# Patient Record
Sex: Female | Born: 1990 | Race: White | Hispanic: No | Marital: Single | Smoking: Never smoker
Health system: Southern US, Community
[De-identification: ages and names within clinical notes are randomized; demographics above are authoritative.]

## PROBLEM LIST (undated history)

## (undated) DIAGNOSIS — K219 Gastro-esophageal reflux disease without esophagitis: Secondary | ICD-10-CM

## (undated) DIAGNOSIS — F988 Other specified behavioral and emotional disorders with onset usually occurring in childhood and adolescence: Secondary | ICD-10-CM

## (undated) HISTORY — DX: Gastro-esophageal reflux disease without esophagitis: K21.9

## (undated) HISTORY — DX: Other specified behavioral and emotional disorders with onset usually occurring in childhood and adolescence: F98.8

## (undated) HISTORY — PX: WISDOM TOOTH EXTRACTION: SHX21

---
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
## 2012-04-05 ENCOUNTER — Encounter: Payer: Self-pay | Admitting: Gastroenterology

## 2012-05-04 ENCOUNTER — Encounter: Payer: Self-pay | Admitting: Gastroenterology

## 2012-05-04 ENCOUNTER — Other Ambulatory Visit (INDEPENDENT_AMBULATORY_CARE_PROVIDER_SITE_OTHER): Payer: BC Managed Care – PPO

## 2012-05-04 ENCOUNTER — Ambulatory Visit (INDEPENDENT_AMBULATORY_CARE_PROVIDER_SITE_OTHER): Payer: BC Managed Care – PPO | Admitting: Gastroenterology

## 2012-05-04 VITALS — BP 94/68 | HR 88 | Ht 64.0 in | Wt 145.5 lb

## 2012-05-04 DIAGNOSIS — R141 Gas pain: Secondary | ICD-10-CM

## 2012-05-04 DIAGNOSIS — K219 Gastro-esophageal reflux disease without esophagitis: Secondary | ICD-10-CM

## 2012-05-04 DIAGNOSIS — R109 Unspecified abdominal pain: Secondary | ICD-10-CM

## 2012-05-04 DIAGNOSIS — R142 Eructation: Secondary | ICD-10-CM

## 2012-05-04 LAB — BASIC METABOLIC PANEL
BUN: 12 mg/dL (ref 6–23)
Calcium: 9.5 mg/dL (ref 8.4–10.5)
Chloride: 102 mEq/L (ref 96–112)
GFR: 100.89 mL/min (ref >60.00)
Glucose, Bld: 75 mg/dL (ref 70–99)
Potassium: 4.2 mEq/L (ref 3.5–5.1)

## 2012-05-04 LAB — HEPATIC FUNCTION PANEL
ALT: 11 U/L (ref 0–35)
AST: 21 U/L (ref 0–37)
Alkaline Phosphatase: 65 U/L (ref 39–117)
Bilirubin, Direct: 0.1 mg/dL (ref 0.0–0.3)
Total Bilirubin: 0.7 mg/dL (ref 0.3–1.2)

## 2012-05-04 LAB — CBC WITH DIFFERENTIAL/PLATELET
Basophils Absolute: 0.1 10*3/uL (ref 0.0–0.1)
Basophils Relative: 0.8 % (ref 0.0–3.0)
Eosinophils Absolute: 0.3 10*3/uL (ref 0.0–0.7)
MCHC: 33.4 g/dL (ref 30.0–36.0)
MCV: 81.7 fl (ref 78.0–100.0)
Monocytes Absolute: 0.8 10*3/uL (ref 0.1–1.0)
Neutrophils Relative %: 55.4 % (ref 43.0–77.0)
Platelets: 201 10*3/uL (ref 150.0–400.0)
RDW: 13.1 % (ref 11.5–14.6)

## 2012-05-04 LAB — TSH: TSH: 2.5 u[IU]/mL (ref 0.35–5.50)

## 2012-05-04 MED ORDER — OMEPRAZOLE 20 MG PO CPDR: 20.0000 mg | DELAYED_RELEASE_CAPSULE | Freq: Every day | ORAL | Status: DC

## 2012-05-04 NOTE — Progress Notes (Signed)
History of Present Illness: This is a 21 year old female here today with her mother, who is a patient of mine. Patient has had a several year history of postprandial abdominal pain and bloating in the epigastrium and the lower abdomen. This is not associated with bowel movements or any bowel habit changes. She states she generally has one bowel movement per day. She does note increased gas and bloating. She has frequent postprandial chest pain and a dry cough. She does not have typical heartburn. Her symptoms have worsened over the past several months. She states she has tried Zantac on an as-needed basis which has improved her upper abdominal and chest symptoms. An upper GI/small bowel follow-through in 2007 revealed: Normal esophagus and stomach. Transitory spasm of pyloric channel, duodenal bulb. No ulcer or mass is seen. Small bowel is normal. Appendix was partially refluxed. Denies weight loss,constipation, diarrhea, change in stool caliber, melena, hematochezia, nausea, vomiting, dysphagia.  Review of Systems: Pertinent positive and negative review of systems were noted in the above HPI section. All other review of systems were otherwise negative.  Current Medications, Allergies, Past Medical History, Past Surgical History, Family History and Social History were reviewed in [employer] record.  Physical Exam: General: Well developed , well nourished, no acute distress Head: Normocephalic and atraumatic Eyes:  sclerae anicteric, EOMI Ears: Normal auditory acuity Mouth: No deformity or lesions Neck: Supple, no masses or thyromegaly Lungs: Clear throughout to auscultation Heart: Regular rate and rhythm; no murmurs, rubs or bruits Abdomen: Soft, non tender and non distended. No masses, hepatosplenomegaly or hernias noted. Normal Bowel sounds Musculoskeletal: Symmetrical with no gross deformities  Skin: No lesions on visible extremities Pulses:  Normal pulses  noted Extremities: No clubbing, cyanosis, edema or deformities noted Neurological: Alert oriented x 4, grossly nonfocal Cervical Nodes:  No significant cervical adenopathy Inguinal Nodes: No significant inguinal adenopathy Psychological:  Alert and cooperative. Normal mood and affect  Assessment and Recommendations:  1. Presumed GERD. Begin omeprazole 20 mg daily and all standard antireflux measures. If symptoms do not come under adequate control consider further evaluation with upper endoscopy.  2. Abdominal pain, gas and bloating. Avoid any foods that exacerbate symptoms. Blood work as ordered. Begin a low gas diet and Levsin 1-2 before meals and as needed.

## 2012-05-04 NOTE — Patient Instructions (Addendum)
Your physician has requested that you go to the basement for the following lab work before leaving today: We have sent the following medications to your pharmacy for you to pick up at your convenience: Omeprazole. Patient advised to avoid spicy, acidic, citrus, chocolate, mints, fruit and fruit juices.  Limit the intake of caffeine, alcohol and Soda.  Don't exercise too soon after eating.  Don't lie down within 3-4 hours of eating.  Elevate the head of your bed. You have been given a Low-gas diet. cc: Maryellen Pile, MD

## 2012-05-05 ENCOUNTER — Telehealth: Payer: Self-pay | Admitting: Gastroenterology

## 2012-05-05 LAB — CELIAC PANEL 10
Gliadin IgA: 5 U/mL (ref <20)
IgA: 229 mg/dL (ref 69–380)

## 2012-05-05 MED ORDER — HYOSCYAMINE SULFATE 0.125 MG SL SUBL: SUBLINGUAL_TABLET | SUBLINGUAL | Status: DC

## 2012-05-05 NOTE — Telephone Encounter (Signed)
Patient's mother states she was supposed to have two medicines sent into the pharmacy yesterday and she only picked up the omeprazole. Read Dr. Ardell Isaacs office note which does state he wants her daughter to also take Levsin as needed. Sent the prescription for Levsin to patient's pharmacy.

## 2012-05-12 ENCOUNTER — Ambulatory Visit: Payer: Self-pay | Admitting: Gastroenterology

## 2012-06-17 ENCOUNTER — Ambulatory Visit (INDEPENDENT_AMBULATORY_CARE_PROVIDER_SITE_OTHER): Payer: BC Managed Care – PPO | Admitting: Gastroenterology

## 2012-06-17 ENCOUNTER — Encounter: Payer: Self-pay | Admitting: Gastroenterology

## 2012-06-17 VITALS — BP 94/70 | HR 96 | Ht 62.75 in | Wt 145.0 lb

## 2012-06-17 DIAGNOSIS — R143 Flatulence: Secondary | ICD-10-CM

## 2012-06-17 DIAGNOSIS — R109 Unspecified abdominal pain: Secondary | ICD-10-CM

## 2012-06-17 DIAGNOSIS — R142 Eructation: Secondary | ICD-10-CM

## 2012-06-17 NOTE — Patient Instructions (Addendum)
cc: Maryellen Pile, MD

## 2012-06-17 NOTE — Progress Notes (Signed)
History of Present Illness: This is a 21 year old female who returns today for follow up of a several year history of postprandial abdominal pain and bloating in the epigastrium and the lower abdomen. After following recommended dietary changes and using omeprazole and hyoscyamine as directed her symptoms have resolved. She's missed before meals doses of hyoscyamine on several occasions and notes mild postprandial symptoms.  Current Medications, Allergies, Past Medical History, Past Surgical History, Family History and Social History were reviewed in [employer] record.  Physical Exam: General: Well developed , well nourished, no acute distress Head: Normocephalic and atraumatic Eyes:  sclerae anicteric, EOMI Ears: Normal auditory acuity Mouth: No deformity or lesions Lungs: Clear throughout to auscultation Heart: Regular rate and rhythm; no murmurs, rubs or bruits Abdomen: Soft, non tender and non distended. No masses, hepatosplenomegaly or hernias noted. Normal Bowel sounds Musculoskeletal: Symmetrical with no gross deformities  Extremities: No clubbing, cyanosis, edema or deformities noted Neurological: Alert oriented x 4, grossly nonfocal Psychological:  Alert and cooperative. Normal mood and affect  Assessment and Recommendations:  1. Presumed GERD and post prandial bowel symptoms. Continue omeprazole 20 mg daily and hyoscyamine before meals as needed. Continue to follow a low gas diet.

## 2012-12-05 ENCOUNTER — Other Ambulatory Visit: Payer: Self-pay

## 2012-12-05 MED ORDER — OMEPRAZOLE 20 MG PO CPDR: 20.0000 mg | DELAYED_RELEASE_CAPSULE | Freq: Every day | ORAL | Status: DC

## 2013-05-04 ENCOUNTER — Ambulatory Visit (INDEPENDENT_AMBULATORY_CARE_PROVIDER_SITE_OTHER): Payer: BC Managed Care – PPO | Admitting: Gastroenterology

## 2013-05-04 ENCOUNTER — Encounter: Payer: Self-pay | Admitting: Gastroenterology

## 2013-05-04 VITALS — BP 90/60 | HR 88 | Ht 62.75 in | Wt 143.2 lb

## 2013-05-04 DIAGNOSIS — R141 Gas pain: Secondary | ICD-10-CM

## 2013-05-04 DIAGNOSIS — R142 Eructation: Secondary | ICD-10-CM

## 2013-05-04 DIAGNOSIS — K219 Gastro-esophageal reflux disease without esophagitis: Secondary | ICD-10-CM | POA: Insufficient documentation

## 2013-05-04 MED ORDER — OMEPRAZOLE 20 MG PO CPDR: 20.0000 mg | DELAYED_RELEASE_CAPSULE | Freq: Every day | ORAL | Status: DC

## 2013-05-04 MED ORDER — HYOSCYAMINE SULFATE 0.125 MG SL SUBL: SUBLINGUAL_TABLET | SUBLINGUAL | Status: DC

## 2013-05-04 NOTE — Progress Notes (Signed)
History of Present Illness: This is a 22 year old female who complains of intermittent intestinal gas associated with pain and flatulence. She feels that milk products may be producing her gas problems. She has no other GI complaints.  Current Medications, Allergies, Past Medical History, Past Surgical History, Family History and Social History were reviewed in [employer] record.  Physical Exam: General: Well developed , well nourished, no acute distress Head: Normocephalic and atraumatic Eyes:  sclerae anicteric, EOMI Ears: Normal auditory acuity Mouth: No deformity or lesions Lungs: Clear throughout to auscultation Heart: Regular rate and rhythm; no murmurs, rubs or bruits Abdomen: Soft, non tender and non distended. No masses, hepatosplenomegaly or hernias noted. Normal Bowel sounds Musculoskeletal: Symmetrical with no gross deformities  Pulses:  Normal pulses noted Extremities: No clubbing, cyanosis, edema or deformities noted Neurological: Alert oriented x 4, grossly nonfocal Psychological:  Alert and cooperative. Normal mood and affect  Assessment and Recommendations:  1. GERD. Intestinal gas. Possible lactose intolerance. Begin a lactose avoidance diet. Trial of Phazyme 4 times a day when necessary. Continue omeprazole 20 mg daily and hyoscyamine as needed.

## 2013-05-04 NOTE — Patient Instructions (Addendum)
We have sent the following medications to your pharmacy for you to pick up at your convenience: Levsin and omeprazole.  Start over the counter Phazyme or Gas-X four times a day as needed for gas and bloating. Samples of phazyme given to get started.   Decrease or eliminate milk and milk products to reduce the amount of gas and bloating.   Thank you for choosing me and [facility].  Venita Lick. Pleas Koch., MD., Clementeen Graham

## 2013-09-20 ENCOUNTER — Encounter: Payer: Self-pay | Admitting: Podiatry

## 2013-09-20 ENCOUNTER — Ambulatory Visit (INDEPENDENT_AMBULATORY_CARE_PROVIDER_SITE_OTHER): Payer: BC Managed Care – PPO | Admitting: Podiatry

## 2013-09-20 VITALS — BP 122/89 | HR 105 | Resp 16

## 2013-09-20 DIAGNOSIS — L84 Corns and callosities: Secondary | ICD-10-CM

## 2013-09-20 NOTE — Progress Notes (Signed)
   Subjective:    Patient ID: Annette Odom, female    DOB: 1991-08-23, 22 y.o.   MRN: [id number]  HPI Comments: i have these spots on my toes that he cut i think a year ago and the right one is really bad compared to the left      Review of Systems     Objective:   Physical Exam: Pulses are strongly palpable bilateral. Minimal bunion deformities noted bilateral. She does have reactive hyperkeratosis to the medial aspect of the IP joint as well as the first metatarsophalangeal joint. More than likely associated with shoe gear.        Assessment & Plan:  Assessment: Causing calluses bilateral foot.  Plan: Debridement of reactive hyperkeratosis bilateral.

## 2014-05-23 ENCOUNTER — Telehealth: Payer: Self-pay | Admitting: Gastroenterology

## 2014-05-23 MED ORDER — OMEPRAZOLE 20 MG PO CPDR: 20.0000 mg | DELAYED_RELEASE_CAPSULE | Freq: Every day | ORAL | Status: DC

## 2014-05-23 MED ORDER — HYOSCYAMINE SULFATE 0.125 MG SL SUBL: SUBLINGUAL_TABLET | SUBLINGUAL | Status: DC

## 2014-05-23 NOTE — Telephone Encounter (Signed)
Prescriptions sent to pharmacy with one refill until scheduled appt.

## 2014-05-24 ENCOUNTER — Other Ambulatory Visit: Payer: Self-pay | Admitting: Gastroenterology

## 2014-07-02 ENCOUNTER — Ambulatory Visit: Payer: BC Managed Care – PPO | Admitting: Gastroenterology

## 2014-07-09 ENCOUNTER — Encounter: Payer: Self-pay | Admitting: Gastroenterology

## 2014-07-09 ENCOUNTER — Ambulatory Visit (INDEPENDENT_AMBULATORY_CARE_PROVIDER_SITE_OTHER): Payer: BC Managed Care – PPO | Admitting: Gastroenterology

## 2014-07-09 VITALS — BP 98/60 | HR 80 | Ht 62.75 in | Wt 145.0 lb

## 2014-07-09 DIAGNOSIS — K219 Gastro-esophageal reflux disease without esophagitis: Secondary | ICD-10-CM

## 2014-07-09 MED ORDER — OMEPRAZOLE 20 MG PO CPDR: 20.0000 mg | DELAYED_RELEASE_CAPSULE | Freq: Every day | ORAL | Status: DC

## 2014-07-09 MED ORDER — HYOSCYAMINE SULFATE 0.125 MG SL SUBL: SUBLINGUAL_TABLET | SUBLINGUAL | Status: DC

## 2014-07-09 NOTE — Progress Notes (Signed)
    History of Present Illness: This is a 23 year old female with GERD and occasional abdominal pain. Her intermittent, mild abdominal pain responds promptly to hyoscyamine as needed. Her reflux symptoms are well-controlled on omeprazole.  Current Medications, Allergies, Past Medical History, Past Surgical History, Family History and Social History were reviewed in [employer] record.  Physical Exam: General: Well developed , well nourished, no acute distress Head: Normocephalic and atraumatic Eyes:  sclerae anicteric, EOMI Ears: Normal auditory acuity Mouth: No deformity or lesions Lungs: Clear throughout to auscultation Heart: Regular rate and rhythm; no murmurs, rubs or bruits Abdomen: Soft, non tender and non distended. No masses, hepatosplenomegaly or hernias noted. Normal Bowel sounds Musculoskeletal: Symmetrical with no gross deformities  Pulses:  Normal pulses noted Extremities: No clubbing, cyanosis, edema or deformities noted Neurological: Alert oriented x 4, grossly nonfocal Psychological:  Alert and cooperative. Normal mood and affect  Assessment and Recommendations:  1. GERD. Continue standard antireflux measures and omeprazole 20 mg daily.  2. Intermittent, mild abdominal pain. Hyoscyamine as needed.

## 2014-07-09 NOTE — Patient Instructions (Signed)
We have sent the following medications to your pharmacy for you to pick up at your convenience:Levsin and Prilosec.  cc: Maryellen Pile, MD

## 2016-01-20 ENCOUNTER — Other Ambulatory Visit (INDEPENDENT_AMBULATORY_CARE_PROVIDER_SITE_OTHER): Payer: BLUE CROSS/BLUE SHIELD

## 2016-01-20 ENCOUNTER — Ambulatory Visit (INDEPENDENT_AMBULATORY_CARE_PROVIDER_SITE_OTHER): Payer: BLUE CROSS/BLUE SHIELD | Admitting: Gastroenterology

## 2016-01-20 ENCOUNTER — Encounter: Payer: Self-pay | Admitting: Gastroenterology

## 2016-01-20 VITALS — BP 100/60 | HR 80 | Ht 63.5 in | Wt 157.4 lb

## 2016-01-20 DIAGNOSIS — L659 Nonscarring hair loss, unspecified: Secondary | ICD-10-CM

## 2016-01-20 DIAGNOSIS — K219 Gastro-esophageal reflux disease without esophagitis: Secondary | ICD-10-CM

## 2016-01-20 DIAGNOSIS — R5383 Other fatigue: Secondary | ICD-10-CM

## 2016-01-20 LAB — CBC WITH DIFFERENTIAL/PLATELET
BASOS PCT: 0.7 % (ref 0.0–3.0)
Basophils Absolute: 0 10*3/uL (ref 0.0–0.1)
EOS PCT: 3.4 % (ref 0.0–5.0)
Eosinophils Absolute: 0.2 10*3/uL (ref 0.0–0.7)
HEMATOCRIT: 42 % (ref 36.0–46.0)
Hemoglobin: 14.1 g/dL (ref 12.0–15.0)
LYMPHS PCT: 22.1 % (ref 12.0–46.0)
Lymphs Abs: 1.5 10*3/uL (ref 0.7–4.0)
MCHC: 33.6 g/dL (ref 30.0–36.0)
MCV: 79.4 fl (ref 78.0–100.0)
MONO ABS: 0.8 10*3/uL (ref 0.1–1.0)
MONOS PCT: 11.8 % (ref 3.0–12.0)
NEUTROS PCT: 62 % (ref 43.0–77.0)
Neutro Abs: 4.2 10*3/uL (ref 1.4–7.7)
Platelets: 233 10*3/uL (ref 150.0–400.0)
RBC: 5.29 Mil/uL — AB (ref 3.87–5.11)
RDW: 13.3 % (ref 11.5–15.5)
WBC: 6.8 10*3/uL (ref 4.0–10.5)

## 2016-01-20 LAB — HEPATIC FUNCTION PANEL
ALBUMIN: 4.4 g/dL (ref 3.5–5.2)
ALT: 13 U/L (ref 0–35)
AST: 18 U/L (ref 0–37)
Alkaline Phosphatase: 66 U/L (ref 39–117)
BILIRUBIN TOTAL: 0.6 mg/dL (ref 0.2–1.2)
Bilirubin, Direct: 0.1 mg/dL (ref 0.0–0.3)
Total Protein: 8 g/dL (ref 6.0–8.3)

## 2016-01-20 LAB — BASIC METABOLIC PANEL
BUN: 16 mg/dL (ref 6–23)
CALCIUM: 9.7 mg/dL (ref 8.4–10.5)
CO2: 29 mEq/L (ref 19–32)
Chloride: 102 mEq/L (ref 96–112)
Creatinine, Ser: 0.72 mg/dL (ref 0.40–1.20)
GFR: 106.75 mL/min (ref >60.00)
Glucose, Bld: 80 mg/dL (ref 70–99)
Potassium: 4.1 mEq/L (ref 3.5–5.1)
Sodium: 138 mEq/L (ref 135–145)

## 2016-01-20 LAB — TSH: TSH: 1.86 u[IU]/mL (ref 0.35–4.50)

## 2016-01-20 MED ORDER — OMEPRAZOLE 20 MG PO CPDR: 20.0000 mg | DELAYED_RELEASE_CAPSULE | Freq: Every day | ORAL | Status: DC | PRN

## 2016-01-20 MED ORDER — HYOSCYAMINE SULFATE 0.125 MG SL SUBL: SUBLINGUAL_TABLET | SUBLINGUAL | Status: DC

## 2016-01-20 NOTE — Patient Instructions (Signed)
We have sent the following medications to your pharmacy for you to pick up at your convenience:omeprazole and levsin.  Your physician has requested that you go to the basement for lab work before leaving today.  Thank you for choosing me and [facility].  Venita LickMalcolm T. Pleas KochStark, Jr., MD., Clementeen GrahamFACG  Normal BMI (Body Mass Index- based on height and weight) is between 19 and 25. Your BMI today is Body mass index is 27.44 kg/(m^2). Marland Kitchen. Please consider follow up  regarding your BMI with your Primary Care Provider.

## 2016-01-20 NOTE — Progress Notes (Signed)
    History of Present Illness: This is a 25 year old female returning for follow-up of GERD and intermittent crampy abdominal pain. She is been off omeprazole for several weeks and notes occasional mild heartburn symptoms. His Levsin once or twice daily with promptly relieves abdominal cramping. For the past few months she has noticed problems with persistent fatigue, more frequent headaches and hair loss. She relates no variation in bowel habits, diarrhea, constipation, weight loss or dysphagia.  Current Medications, Allergies, Past Medical History, Past Surgical History, Family History and Social History were reviewed in [facility] electronic medical record.  Physical Exam: General: Well developed, well nourished, no acute distress Head: Normocephalic and atraumatic Eyes:  sclerae anicteric, EOMI Ears: Normal auditory acuity Mouth: No deformity or lesions Lungs: Clear throughout to auscultation Heart: Regular rate and rhythm; no murmurs, rubs or bruits Abdomen: Soft, non tender and non distended. No masses, hepatosplenomegaly or hernias noted. Normal Bowel sounds Musculoskeletal: Symmetrical with no gross deformities  Pulses:  Normal pulses noted Extremities: No clubbing, cyanosis, edema or deformities noted Neurological: Alert oriented x 4, grossly nonfocal Psychological:  Alert and cooperative. Normal mood and affect  Assessment and Recommendations:  1. GERD and intermittent crampy abdominal pain. Standard antireflux measures and omeprazole 20 mg daily as needed. Hyoscyamine 0.125 mg 1-2 before meals and every 4 hours as needed. REV in 1 year and prn.   2. Hair loss, fatigue and increased headaches. R/O thyroid disorder and other disorders. CMP, CBC and TSH today. If no cause is uncovered on bloodwork she is advised to follow-up with her PCP.

## 2016-04-23 ENCOUNTER — Telehealth: Payer: Self-pay | Admitting: Gastroenterology

## 2016-04-23 MED ORDER — DICYCLOMINE HCL 10 MG PO CAPS: 10.0000 mg | ORAL_CAPSULE | Freq: Four times a day (QID) | ORAL | Status: DC | PRN

## 2016-04-23 NOTE — Telephone Encounter (Signed)
Can we switch patient to dicyclomine?

## 2016-04-23 NOTE — Telephone Encounter (Signed)
Prescription sent to patient's pharmacy. Patient notified.  

## 2016-04-23 NOTE — Telephone Encounter (Signed)
dicyclomine 10 mg po qid prn, 1 year of refills

## 2017-08-17 ENCOUNTER — Telehealth: Payer: Self-pay | Admitting: Gastroenterology

## 2017-08-17 MED ORDER — DICYCLOMINE HCL 10 MG PO CAPS: 10.0000 mg | ORAL_CAPSULE | Freq: Four times a day (QID) | ORAL | 0 refills | Status: DC | PRN

## 2017-08-17 NOTE — Telephone Encounter (Signed)
Prescription sent to patient's pharmacy. Patient notified to keep appt for further refills.

## 2017-10-15 ENCOUNTER — Ambulatory Visit: Payer: BLUE CROSS/BLUE SHIELD | Admitting: Gastroenterology

## 2017-10-15 ENCOUNTER — Encounter: Payer: Self-pay | Admitting: Gastroenterology

## 2017-10-15 VITALS — BP 94/74 | HR 100 | Ht 63.5 in | Wt 167.4 lb

## 2017-10-15 DIAGNOSIS — K921 Melena: Secondary | ICD-10-CM | POA: Diagnosis not present

## 2017-10-15 DIAGNOSIS — K5904 Chronic idiopathic constipation: Secondary | ICD-10-CM

## 2017-10-15 NOTE — Patient Instructions (Signed)
Start over the counter Miralax 17 grams in 8 oz of water 1-2 x daily.   Call back if your symptoms of constipation are not better or if you have recurrent rectal bleeding.   Normal BMI (Body Mass Index- based on height and weight) is between 19 and 25. Your BMI today is Body mass index is 29.18 kg/m. Marland Kitchen. Please consider follow up  regarding your BMI with your Primary Care Provider.  Thank you for choosing me and [facility].  Venita LickMalcolm T. Pleas KochStark, Jr., MD., Clementeen GrahamFACG

## 2017-10-15 NOTE — Progress Notes (Signed)
    History of Present Illness: This is a 27 year old female who related rectal bleeding around Thanksgiving that has resolved after 2 days. She developed constipation before Thanksgiving during the course of antibiotics for strep throat.  Her constipation has persisted.  Recently she has noted improvement with daily Miralax.  She has not noted any rectal bleeding since Thanksgiving.  She notes occasional mild left abdomen cramping that is well controlled with dicyclomine.  Current Medications, Allergies, Past Medical History, Past Surgical History, Family History and Social History were reviewed in [facility] electronic medical record.  Physical Exam: General: Well developed, well nourished, no acute distress Head: Normocephalic and atraumatic Eyes:  sclerae anicteric, EOMI Ears: Normal auditory acuity Mouth: No deformity or lesions Lungs: Clear throughout to auscultation Heart: Regular rate and rhythm; no murmurs, rubs or bruits Abdomen: Soft, non tender and non distended. No masses, hepatosplenomegaly or hernias noted. Normal Bowel sounds Rectal: no lesions, no tenderness, heme neg brown stool Musculoskeletal: Symmetrical with no gross deformities  Pulses:  Normal pulses noted Extremities: No clubbing, cyanosis, edema or deformities noted Neurological: Alert oriented x 4, grossly nonfocal Psychological:  Alert and cooperative. Normal mood and affect  Assessment and Recommendations:  1. Hematochezia, self limited.  Highly likely to be benign self-limited process such as a mild mucosal abrasion or small internal hemorrhoids.  If rectal bleeding recurs she is advised to call back and we will consider flexible sigmoidoscopy or colonoscopy to further evaluate.  2. Constipation.  Continue MiraLAX once or twice daily titrated for adequate bowel movements.  If constipation is not adequately addressed with MiraLAX she is advised to call for further management plans.  3.  Intermittent  left-sided abdominal cramping.  Continue dicyclomine qid prn.  Currently she takes dicyclomine once or twice a day with very good control of symptoms. REV in 1 year.

## 2017-12-20 ENCOUNTER — Other Ambulatory Visit: Payer: Self-pay | Admitting: Gastroenterology

## 2017-12-20 MED ORDER — DICYCLOMINE HCL 10 MG PO CAPS: 10.0000 mg | ORAL_CAPSULE | Freq: Four times a day (QID) | ORAL | 0 refills | Status: DC | PRN

## 2017-12-20 NOTE — Telephone Encounter (Signed)
Per last office note Dr Russella DarStark said to continue her dicyclomine. Refilled as requested.

## 2018-01-22 ENCOUNTER — Other Ambulatory Visit: Payer: Self-pay | Admitting: Gastroenterology

## 2018-04-22 ENCOUNTER — Telehealth: Payer: Self-pay | Admitting: Gastroenterology

## 2018-04-22 MED ORDER — DICYCLOMINE HCL 10 MG PO CAPS: ORAL_CAPSULE | ORAL | 5 refills | Status: DC

## 2018-04-22 NOTE — Telephone Encounter (Signed)
Sent prescription to pharmacy and patient notified.

## 2018-08-17 ENCOUNTER — Telehealth: Payer: Self-pay | Admitting: Gastroenterology

## 2018-08-17 MED ORDER — OMEPRAZOLE 20 MG PO CPDR: 20.0000 mg | DELAYED_RELEASE_CAPSULE | Freq: Every day | ORAL | 1 refills | Status: DC | PRN

## 2018-08-17 NOTE — Telephone Encounter (Signed)
Pt needs rf for omeprazole sent to [facility] on [address]

## 2018-08-17 NOTE — Telephone Encounter (Signed)
Prescription sent to patient's pharmacy and patient notified.  

## 2019-02-21 ENCOUNTER — Other Ambulatory Visit: Payer: Self-pay | Admitting: Gastroenterology

## 2020-02-15 ENCOUNTER — Other Ambulatory Visit: Payer: Self-pay | Admitting: Dermatology

## 2020-02-19 ENCOUNTER — Telehealth: Payer: Self-pay | Admitting: Gastroenterology

## 2020-02-19 MED ORDER — DICYCLOMINE HCL 10 MG PO CAPS: ORAL_CAPSULE | ORAL | 0 refills | Status: DC

## 2020-02-19 NOTE — Telephone Encounter (Signed)
Left message for patient to return my call.

## 2020-02-19 NOTE — Telephone Encounter (Signed)
Patient returned your call.

## 2020-02-19 NOTE — Telephone Encounter (Addendum)
Bentyl refill request to be sent to [facility] in [facility] [address]

## 2020-02-19 NOTE — Telephone Encounter (Signed)
Patient scheduled appt for June. Prescription sent to patient's pharmacy until scheduled appt.

## 2020-03-12 ENCOUNTER — Other Ambulatory Visit: Payer: Self-pay | Admitting: Gastroenterology

## 2020-03-22 ENCOUNTER — Ambulatory Visit: Payer: BC Managed Care – PPO | Admitting: Physician Assistant

## 2020-03-22 ENCOUNTER — Encounter: Payer: Self-pay | Admitting: Physician Assistant

## 2020-03-22 ENCOUNTER — Other Ambulatory Visit (INDEPENDENT_AMBULATORY_CARE_PROVIDER_SITE_OTHER): Payer: BC Managed Care – PPO

## 2020-03-22 VITALS — BP 112/60 | HR 72 | Ht 63.0 in | Wt 150.6 lb

## 2020-03-22 DIAGNOSIS — R109 Unspecified abdominal pain: Secondary | ICD-10-CM

## 2020-03-22 DIAGNOSIS — L659 Nonscarring hair loss, unspecified: Secondary | ICD-10-CM

## 2020-03-22 LAB — COMPREHENSIVE METABOLIC PANEL
ALT: 15 U/L (ref 0–35)
AST: 18 U/L (ref 0–37)
Albumin: 4.5 g/dL (ref 3.5–5.2)
Alkaline Phosphatase: 63 U/L (ref 39–117)
BUN: 12 mg/dL (ref 6–23)
CO2: 32 mEq/L (ref 19–32)
Calcium: 9.6 mg/dL (ref 8.4–10.5)
Chloride: 103 mEq/L (ref 96–112)
Creatinine, Ser: 0.72 mg/dL (ref 0.40–1.20)
GFR: 97.09 mL/min (ref >60.00)
Glucose, Bld: 84 mg/dL (ref 70–99)
Potassium: 4.5 mEq/L (ref 3.5–5.1)
Sodium: 138 mEq/L (ref 135–145)
Total Bilirubin: 0.4 mg/dL (ref 0.2–1.2)
Total Protein: 7.6 g/dL (ref 6.0–8.3)

## 2020-03-22 LAB — CBC WITH DIFFERENTIAL/PLATELET
Basophils Absolute: 0 10*3/uL (ref 0.0–0.1)
Basophils Relative: 0.9 % (ref 0.0–3.0)
Eosinophils Absolute: 0.2 10*3/uL (ref 0.0–0.7)
Eosinophils Relative: 3.5 % (ref 0.0–5.0)
HCT: 41.2 % (ref 36.0–46.0)
Hemoglobin: 13.7 g/dL (ref 12.0–15.0)
Lymphocytes Relative: 26.7 % (ref 12.0–46.0)
Lymphs Abs: 1.5 10*3/uL (ref 0.7–4.0)
MCHC: 33.4 g/dL (ref 30.0–36.0)
MCV: 83.2 fl (ref 78.0–100.0)
Monocytes Absolute: 0.7 10*3/uL (ref 0.1–1.0)
Monocytes Relative: 11.7 % (ref 3.0–12.0)
Neutro Abs: 3.2 10*3/uL (ref 1.4–7.7)
Neutrophils Relative %: 57.2 % (ref 43.0–77.0)
Platelets: 244 10*3/uL (ref 150.0–400.0)
RBC: 4.95 Mil/uL (ref 3.87–5.11)
RDW: 13.8 % (ref 11.5–15.5)
WBC: 5.7 10*3/uL (ref 4.0–10.5)

## 2020-03-22 MED ORDER — DICYCLOMINE HCL 10 MG PO CAPS: ORAL_CAPSULE | ORAL | 3 refills | Status: DC

## 2020-03-22 NOTE — Progress Notes (Signed)
Chief Complaint: Follow-up abdominal cramping and constipation  HPI:    Annette Odom is a 29 year old Caucasian female with a past medical history as listed below, known to Dr. Fuller Plan, who returns to clinic today for follow-up of her constipation and abdominal cramping.    10/15/2016 patient seen in clinic by Dr. Fuller Plan for rectal bleeding and constipation.  At that time as discussed the hematochezia was self-limited and highly likely benign.  It was recommended bleeding recurred then should consider flex sig or colonoscopy.  She was continued on MiraLAX once or twice a day for her constipation.  Intermittent left-sided cramping was also discussed and patient continued on Dicyclomine 4 times daily as needed.    Today, patient presents to clinic and tells me that she is doing well, she continues to take 1 Dicyclomine 10 mg in the morning and then occasionally has to take a second for abdominal cramping, but this works well for her.  Tells me her constipation is resolved and she no longer has reflux.    Her one complaint today is that her hair has been falling out in clumps more so recently.  Tells me that she had increased stress over the past year as she is a second grade teacher and things were "so different with Covid".  Tells me her sister recently got diagnosed with a "thyroid issue".    Denies fever, chills or weight loss.  Past Medical History:  Diagnosis Date  . ADD (attention deficit disorder)   . GERD (gastroesophageal reflux disease)     Past Surgical History:  Procedure Laterality Date  . WISDOM TOOTH EXTRACTION      Current Outpatient Medications  Medication Sig Dispense Refill  . cetirizine (ZYRTEC) 10 MG tablet Take 10 mg by mouth daily.    . Dapsone 5 % topical gel APPLY TO ACNE AT BEDTIME AS DIRECTED 60 g 1  . dicyclomine (BENTYL) 10 MG capsule TAKE 1 CAPSULE BY MOUTH 4 TIMES A DAY AS NEEDED FOR SPASMS 120 capsule 0  . methylphenidate (CONCERTA) 54 MG CR tablet Take 54 mg by  mouth every morning.    Marland Kitchen omeprazole (PRILOSEC) 20 MG capsule Take 1 capsule (20 mg total) by mouth daily as needed. 30 capsule 1   No current facility-administered medications for this visit.    Allergies as of 03/22/2020  . (No Known Allergies)    Family History  Problem Relation Age of Onset  . Crohn's disease Cousin   . Heart disease Maternal Grandfather     Social History   Socioeconomic History  . Marital status: Single    Spouse name: Not on file  . Number of children: 0  . Years of education: Not on file  . Highest education level: Not on file  Occupational History  . Occupation: Pharmacist, hospital  Tobacco Use  . Smoking status: Never Smoker  . Smokeless tobacco: Never Used  Substance and Sexual Activity  . Alcohol use: No  . Drug use: No  . Sexual activity: Not on file  Other Topics Concern  . Not on file  Social History Narrative  . Not on file   Social Determinants of Health   Financial Resource Strain:   . Difficulty of Paying Living Expenses:   Food Insecurity:   . Worried About Charity fundraiser in the Last Year:   . Arboriculturist in the Last Year:   Transportation Needs:   . Film/video editor (Medical):   Marland Kitchen Lack of  Transportation (Non-Medical):   Physical Activity:   . Days of Exercise per Week:   . Minutes of Exercise per Session:   Stress:   . Feeling of Stress :   Social Connections:   . Frequency of Communication with Friends and Family:   . Frequency of Social Gatherings with Friends and Family:   . Attends Religious Services:   . Active Member of Clubs or Organizations:   . Attends Banker Meetings:   Marland Kitchen Marital Status:   Intimate Partner Violence:   . Fear of Current or Ex-Partner:   . Emotionally Abused:   Marland Kitchen Physically Abused:   . Sexually Abused:     Review of Systems:    Constitutional: No weight loss, fever or chills Cardiovascular: No chest pain Respiratory: No SOB  Gastrointestinal: See HPI and otherwise  negative   Physical Exam:  Vital signs: BP 112/60   Pulse 72   Ht 5\' 3"  (1.6 m)   Wt 150 lb 9.6 oz (68.3 kg)   BMI 26.68 kg/m   Constitutional:   Pleasant Caucasian female appears to be in NAD, Well developed, Well nourished, alert and cooperative Head:  Normocephalic and atraumatic. Eyes:   PEERL, EOMI. No icterus. Conjunctiva pink. Ears:  Normal auditory acuity. Neck:  Supple Throat: Oral cavity and pharynx without inflammation, swelling or lesion.  Respiratory: Respirations even and unlabored. Lungs clear to auscultation bilaterally.   No wheezes, crackles, or rhonchi.  Cardiovascular: Normal S1, S2. No MRG. Regular rate and rhythm. No peripheral edema, cyanosis or pallor.  Gastrointestinal:  Soft, nondistended, nontender. No rebound or guarding. Normal bowel sounds. No appreciable masses or hepatomegaly. Rectal:  Not performed.  Msk:  Symmetrical without gross deformities. Without edema, no deformity or joint abnormality.  Neurologic:  Alert and  oriented x4;  grossly normal neurologically.  Skin:   Dry and intact without significant lesions or rashes. Psychiatric:  Demonstrates good judgement and reason without abnormal affect or behaviors.  No recent labs or imaging.  Assessment: 1.  Abdominal cramping: Controlled with Dicyclomine 10 mg 1-2 times daily 2.  Hair loss: Patient wonders if she has thyroid issues and has not seen PCP lately  Plan: 1.  Discussed that I would check patient's labs today for her including CBC, CMP, TSH and free T4.  Did explain that if there are abnormalities she will need to follow-up with her primary care provider in regards to this. 2.  Refill Dicyclomine 10 mg 1-2 tabs daily #180 with 3 refills. 3.  Patient to return to clinic in a year with Dr. or myself or sooner if necessary.  Russella Dar, PA-C [facility] 03/22/2020, 10:06 AM  Cc: 05/22/2020, MD

## 2020-03-22 NOTE — Progress Notes (Signed)
Reviewed and agree with management plan.  Collyns Mcquigg T. Tavon Corriher, MD FACG [facility]  

## 2020-03-22 NOTE — Patient Instructions (Addendum)
If you are age 27 or older, your body mass index should be between 23-30. Your Body mass index is 26.68 kg/m. If this is out of the aforementioned range listed, please consider follow up with your Primary Care Provider.  If you are age 7 or younger, your body mass index should be between 19-25. Your Body mass index is 26.68 kg/m. If this is out of the aformentioned range listed, please consider follow up with your Primary Care Provider.   Your provider has requested that you go to the basement level for lab work before leaving today. Press "B" on the elevator. The lab is located at the first door on the left as you exit the elevator.  Due to recent changes in healthcare laws, you may see the results of your imaging and laboratory studies on MyChart before your provider has had a chance to review them.  We understand that in some cases there may be results that are confusing or concerning to you. Not all laboratory results come back in the same time frame and the provider may be waiting for multiple results in order to interpret others.  Please give [place] 48 hours in order for your provider to thoroughly review all the results before contacting the office for clarification of your results.   Refills of Bentyl have been sent to your pharmacy.  You will be due for a follow up in 1 year. Please contact the office a month ahead to schedule.

## 2020-03-23 LAB — TSH+FREE T4: TSH W/REFLEX TO FT4: 1.87 mIU/L

## 2020-11-12 ENCOUNTER — Ambulatory Visit: Payer: BC Managed Care – PPO | Admitting: Physician Assistant

## 2021-04-18 ENCOUNTER — Other Ambulatory Visit: Payer: Self-pay | Admitting: Physician Assistant

## 2021-07-13 ENCOUNTER — Other Ambulatory Visit: Payer: Self-pay | Admitting: Physician Assistant

## 2021-07-14 NOTE — Telephone Encounter (Signed)
Patient needs office visit,

## 2022-05-11 ENCOUNTER — Other Ambulatory Visit: Payer: Self-pay | Admitting: Physician Assistant

## 2023-06-04 ENCOUNTER — Other Ambulatory Visit: Payer: Self-pay

## 2023-06-04 DIAGNOSIS — R55 Syncope and collapse: Secondary | ICD-10-CM | POA: Diagnosis not present

## 2023-06-04 DIAGNOSIS — I517 Cardiomegaly: Secondary | ICD-10-CM | POA: Insufficient documentation

## 2023-06-05 ENCOUNTER — Emergency Department (HOSPITAL_BASED_OUTPATIENT_CLINIC_OR_DEPARTMENT_OTHER): Payer: BC Managed Care – PPO

## 2023-06-05 ENCOUNTER — Encounter (HOSPITAL_BASED_OUTPATIENT_CLINIC_OR_DEPARTMENT_OTHER): Payer: Self-pay | Admitting: Emergency Medicine

## 2023-06-05 ENCOUNTER — Observation Stay (HOSPITAL_BASED_OUTPATIENT_CLINIC_OR_DEPARTMENT_OTHER)
Admission: EM | Admit: 2023-06-05 | Discharge: 2023-06-05 | Disposition: A | Discharge status: Home / Self Care | Payer: BC Managed Care – PPO | Attending: Family Medicine | Admitting: Family Medicine

## 2023-06-05 ENCOUNTER — Other Ambulatory Visit: Payer: Self-pay

## 2023-06-05 ENCOUNTER — Observation Stay (HOSPITAL_COMMUNITY): Payer: BC Managed Care – PPO

## 2023-06-05 DIAGNOSIS — F988 Other specified behavioral and emotional disorders with onset usually occurring in childhood and adolescence: Secondary | ICD-10-CM | POA: Diagnosis not present

## 2023-06-05 DIAGNOSIS — R0789 Other chest pain: Secondary | ICD-10-CM | POA: Diagnosis not present

## 2023-06-05 DIAGNOSIS — I517 Cardiomegaly: Secondary | ICD-10-CM | POA: Diagnosis not present

## 2023-06-05 DIAGNOSIS — R55 Syncope and collapse: Principal | ICD-10-CM

## 2023-06-05 DIAGNOSIS — K219 Gastro-esophageal reflux disease without esophagitis: Secondary | ICD-10-CM | POA: Diagnosis present

## 2023-06-05 LAB — CBC WITH DIFFERENTIAL/PLATELET
Abs Immature Granulocytes: 0.04 10*3/uL (ref 0.00–0.07)
Abs Immature Granulocytes: 0.04 10*3/uL (ref 0.00–0.07)
Basophils Absolute: 0 10*3/uL (ref 0.0–0.1)
Basophils Absolute: 0 10*3/uL (ref 0.0–0.1)
Basophils Relative: 0 %
Basophils Relative: 1 %
Eosinophils Absolute: 0.1 10*3/uL (ref 0.0–0.5)
Eosinophils Absolute: 0.2 10*3/uL (ref 0.0–0.5)
Eosinophils Relative: 2 %
Eosinophils Relative: 2 %
HCT: 42.1 % (ref 36.0–46.0)
HCT: 42.2 % (ref 36.0–46.0)
Hemoglobin: 14.4 g/dL (ref 12.0–15.0)
Hemoglobin: 14.4 g/dL (ref 12.0–15.0)
Immature Granulocytes: 0 %
Immature Granulocytes: 1 %
Lymphocytes Relative: 14 %
Lymphocytes Relative: 20 %
Lymphs Abs: 1.3 10*3/uL (ref 0.7–4.0)
Lymphs Abs: 1.7 10*3/uL (ref 0.7–4.0)
MCH: 27.4 pg (ref 26.0–34.0)
MCH: 27.6 pg (ref 26.0–34.0)
MCHC: 34.1 g/dL (ref 30.0–36.0)
MCHC: 34.2 g/dL (ref 30.0–36.0)
MCV: 80.2 fL (ref 80.0–100.0)
MCV: 80.7 fL (ref 80.0–100.0)
Monocytes Absolute: 0.8 10*3/uL (ref 0.1–1.0)
Monocytes Absolute: 0.9 10*3/uL (ref 0.1–1.0)
Monocytes Relative: 10 %
Monocytes Relative: 9 %
Neutro Abs: 5.8 10*3/uL (ref 1.7–7.7)
Neutro Abs: 7.2 10*3/uL (ref 1.7–7.7)
Neutrophils Relative %: 66 %
Neutrophils Relative %: 75 %
Platelets: 255 10*3/uL (ref 150–400)
Platelets: 260 10*3/uL (ref 150–400)
RBC: 5.22 MIL/uL — ABNORMAL HIGH (ref 3.87–5.11)
RBC: 5.26 MIL/uL — ABNORMAL HIGH (ref 3.87–5.11)
RDW: 12.1 % (ref 11.5–15.5)
RDW: 12.3 % (ref 11.5–15.5)
WBC: 8.6 10*3/uL (ref 4.0–10.5)
WBC: 9.7 10*3/uL (ref 4.0–10.5)
nRBC: 0 % (ref 0.0–0.2)
nRBC: 0 % (ref 0.0–0.2)

## 2023-06-05 LAB — BASIC METABOLIC PANEL
Anion gap: 11 (ref 5–15)
BUN: 20 mg/dL (ref 6–20)
CO2: 24 mmol/L (ref 22–32)
Calcium: 9.7 mg/dL (ref 8.9–10.3)
Chloride: 104 mmol/L (ref 98–111)
Creatinine, Ser: 0.8 mg/dL (ref 0.44–1.00)
GFR, Estimated: >60 mL/min (ref >60)
Glucose, Bld: 108 mg/dL — ABNORMAL HIGH (ref 70–99)
Potassium: 3.8 mmol/L (ref 3.5–5.1)
Sodium: 139 mmol/L (ref 135–145)

## 2023-06-05 LAB — TROPONIN I (HIGH SENSITIVITY)
Troponin I (High Sensitivity): <2 ng/L (ref <18)
Troponin I (High Sensitivity): <2 ng/L (ref <18)
Troponin I (High Sensitivity): 3 ng/L (ref <18)

## 2023-06-05 LAB — COMPREHENSIVE METABOLIC PANEL
ALT: 15 U/L (ref 0–44)
AST: 18 U/L (ref 15–41)
Albumin: 3.8 g/dL (ref 3.5–5.0)
Alkaline Phosphatase: 60 U/L (ref 38–126)
Anion gap: 14 (ref 5–15)
BUN: 11 mg/dL (ref 6–20)
CO2: 23 mmol/L (ref 22–32)
Calcium: 9.1 mg/dL (ref 8.9–10.3)
Chloride: 101 mmol/L (ref 98–111)
Creatinine, Ser: 0.85 mg/dL (ref 0.44–1.00)
GFR, Estimated: >60 mL/min (ref >60)
Glucose, Bld: 103 mg/dL — ABNORMAL HIGH (ref 70–99)
Potassium: 4.1 mmol/L (ref 3.5–5.1)
Sodium: 138 mmol/L (ref 135–145)
Total Bilirubin: 0.7 mg/dL (ref 0.3–1.2)
Total Protein: 7.5 g/dL (ref 6.5–8.1)

## 2023-06-05 LAB — ECHOCARDIOGRAM COMPLETE
Area-P 1/2: 3.66 cm2
Height: 63 in
S' Lateral: 2.95 cm
Weight: 2516.8 oz

## 2023-06-05 LAB — PREGNANCY, URINE: Preg Test, Ur: NEGATIVE

## 2023-06-05 LAB — C-REACTIVE PROTEIN: CRP: <0.5 mg/dL (ref <1.0)

## 2023-06-05 LAB — LIPASE, BLOOD: Lipase: 30 U/L (ref 11–51)

## 2023-06-05 LAB — SEDIMENTATION RATE: Sed Rate: 3 mm/hr (ref 0–22)

## 2023-06-05 LAB — MAGNESIUM: Magnesium: 2.2 mg/dL (ref 1.7–2.4)

## 2023-06-05 LAB — D-DIMER, QUANTITATIVE: D-Dimer, Quant: 1.09 ug{FEU}/mL — ABNORMAL HIGH (ref 0.00–0.50)

## 2023-06-05 MED ORDER — ACETAMINOPHEN 650 MG RE SUPP: 650.0000 mg | Freq: Four times a day (QID) | RECTAL | Status: DC | PRN

## 2023-06-05 MED ORDER — DICYCLOMINE HCL 10 MG PO CAPS
10.0000 mg | ORAL_CAPSULE | Freq: Three times a day (TID) | ORAL | Status: DC | PRN
  Filled 2023-06-05: qty 1

## 2023-06-05 MED ORDER — POTASSIUM CHLORIDE CRYS ER 20 MEQ PO TBCR
40.0000 meq | EXTENDED_RELEASE_TABLET | Freq: Once | ORAL | Status: AC
  Administered 2023-06-05: 40 meq via ORAL
  Filled 2023-06-05: qty 2

## 2023-06-05 MED ORDER — ACETAMINOPHEN 325 MG PO TABS: 650.0000 mg | ORAL_TABLET | Freq: Four times a day (QID) | ORAL | Status: DC | PRN

## 2023-06-05 MED ORDER — ORAL CARE MOUTH RINSE: 15.0000 mL | OROMUCOSAL | Status: DC | PRN

## 2023-06-05 MED ORDER — IOHEXOL 350 MG/ML SOLN
100.0000 mL | Freq: Once | INTRAVENOUS | Status: AC | PRN
  Administered 2023-06-05: 75 mL via INTRAVENOUS

## 2023-06-05 MED ORDER — ONDANSETRON HCL 4 MG/2ML IJ SOLN: 4.0000 mg | Freq: Four times a day (QID) | INTRAMUSCULAR | Status: DC | PRN

## 2023-06-05 MED ORDER — PANTOPRAZOLE SODIUM 40 MG IV SOLR
40.0000 mg | INTRAVENOUS | Status: DC
  Administered 2023-06-05: 40 mg via INTRAVENOUS
  Filled 2023-06-05: qty 10

## 2023-06-05 MED ORDER — MELATONIN 3 MG PO TABS: 3.0000 mg | ORAL_TABLET | Freq: Every evening | ORAL | Status: DC | PRN

## 2023-06-05 NOTE — Discharge Summary (Signed)
Physician Discharge Summary   Patient: Annette Odom MRN: [id number] DOB: 28-Jul-1991  Admit date:     06/05/2023  Discharge date: 06/05/23  Discharge Physician: Alberteen Sam   PCP: Julious Oka    Recommendations at discharge:  Follow up with PCP Julious Oka for syncope     Discharge Diagnoses: Principal Problem:   Syncope, likely vasovagal Active Problems:   GERD (gastroesophageal reflux disease)   Atypical chest pain   ADD (attention deficit disorder)      Hospital Course: Ms Guetter is a 32 y.o. F with ADD who presented with acute chest pain and syncope.  Patient was in usual state of health until the day of admission when she was drinking a [facility] developed central chest pressure, then arm tingling, rapid breathing and passed out.     Syncope CTA chest ruled out PE and showed no lung abnormality.  Troponins negative. Lipase and CRP normal. ECG normal and overnight monitoring on telemetry showed only sinus rhythm.  She underwent echocardiogram that showed normal EF 55-60%, normal valves, no cardiomegaly.    Suspect a vagal episode, maybe prompted by esophageal pain/hypersensitivity and carbonated soda.         The [facility] Controlled Substances Registry was reviewed for this patient prior to discharge.   Consultants: None Procedures performed: Echocardiogram  Disposition: Home Diet recommendation:  Discharge Diet Orders (From admission, onward)     Start     Ordered   06/05/23 0000  Diet - low sodium heart healthy        06/05/23 1632             DISCHARGE MEDICATION: Allergies as of 06/05/2023   No Known Allergies      Medication List     TAKE these medications    albuterol 108 (90 Base) MCG/ACT inhaler Commonly known as: VENTOLIN HFA Inhale 2 puffs into the lungs every 4 (four) hours as needed for wheezing or shortness of breath.   dicyclomine 10 MG capsule Commonly known as: BENTYL TAKE 1 CAPSULE BY MOUTH 1  OR 2 TIMES DAILY AS NEEDED FOR SPASMS   fluticasone 50 MCG/ACT nasal spray Commonly known as: FLONASE Place 2 sprays into both nostrils daily as needed for allergies or rhinitis.   Junel FE 1/20 1-20 MG-MCG tablet Generic drug: norethindrone-ethinyl estradiol-FE Take 1 tablet by mouth daily.   levocetirizine 5 MG tablet Commonly known as: XYZAL Take 5 mg by mouth daily.   methylphenidate 54 MG CR tablet Commonly known as: CONCERTA Take 54 mg by mouth every morning.        Follow-up Information     Maryellen Pile, MD. Schedule an appointment as soon as possible for a visit in 1 week(s).   Specialty: Pediatrics Contact information: [address] [place] [address] [phone]                 Discharge Instructions     Diet - low sodium heart healthy   Complete by: As directed    Increase activity slowly   Complete by: As directed        Discharge Exam: Filed Weights   06/05/23 0006 06/05/23 0507  Weight: 70.3 kg 71.4 kg    General: Pt is alert, awake, not in acute distress Cardiovascular: RRR, nl S1-S2, no murmurs appreciated.   No LE edema.   Respiratory: Normal respiratory rate and rhythm.  CTAB without rales or wheezes. Abdominal: Abdomen soft and non-tender.  No distension or  HSM.   Neuro/Psych: Strength symmetric in upper and lower extremities.  Judgment and insight appear normal.   Condition at discharge: good  The results of significant diagnostics from this hospitalization (including imaging, microbiology, ancillary and laboratory) are listed below for reference.   Imaging Studies: ECHOCARDIOGRAM COMPLETE  Result Date: 06/05/2023    ECHOCARDIOGRAM REPORT   Patient Name:   Annette Odom Date of Exam: 06/05/2023 Medical Rec #:  [id number]        Height:       63.0 in Accession #:    [id number]       Weight:       157.3 lb Date of Birth:  March 31, 1991         BSA:          1.746 m Patient Age:    32 years         BP:           128/84  mmHg Patient Gender: F                HR:           75 bpm. Exam Location:  Inpatient Procedure: 2D Echo, Color Doppler and Cardiac Doppler Indications:    R55 Syncope  History:        Patient has no prior history of Echocardiogram examinations.  Sonographer:    Irving Burton Senior RDCS Referring Phys: [id number] Angie Fava IMPRESSIONS  1. Left ventricular ejection fraction, by estimation, is 55 to 60%. The left ventricle has normal function. The left ventricle has no regional wall motion abnormalities. Left ventricular diastolic parameters were normal.  2. Right ventricular systolic function is normal. The right ventricular size is normal. Tricuspid regurgitation signal is inadequate for assessing PA pressure.  3. The mitral valve is normal in structure. Trivial mitral valve regurgitation.  4. The aortic valve is tricuspid. Aortic valve regurgitation is trivial. No aortic stenosis is present.  5. The inferior vena cava is normal in size with greater than 50% respiratory variability, suggesting right atrial pressure of 3 mmHg. FINDINGS  Left Ventricle: Left ventricular ejection fraction, by estimation, is 55 to 60%. The left ventricle has normal function. The left ventricle has no regional wall motion abnormalities. The left ventricular internal cavity size was normal in size. There is  no left ventricular hypertrophy. Left ventricular diastolic parameters were normal. Right Ventricle: The right ventricular size is normal. No increase in right ventricular wall thickness. Right ventricular systolic function is normal. Tricuspid regurgitation signal is inadequate for assessing PA pressure. Left Atrium: Left atrial size was normal in size. Right Atrium: Right atrial size was normal in size. Pericardium: There is no evidence of pericardial effusion. Mitral Valve: The mitral valve is normal in structure. Trivial mitral valve regurgitation. Tricuspid Valve: The tricuspid valve is normal in structure. Tricuspid valve  regurgitation is trivial. Aortic Valve: The aortic valve is tricuspid. Aortic valve regurgitation is trivial. No aortic stenosis is present. Pulmonic Valve: The pulmonic valve was normal in structure. Pulmonic valve regurgitation is trivial. Aorta: The aortic root and ascending aorta are structurally normal, with no evidence of dilitation. Venous: The inferior vena cava is normal in size with greater than 50% respiratory variability, suggesting right atrial pressure of 3 mmHg. IAS/Shunts: No atrial level shunt detected by color flow Doppler.  LEFT VENTRICLE PLAX 2D LVIDd:         4.10 cm   Diastology LVIDs:         2.95  cm   LV e' medial:    10.30 cm/s LV PW:         0.70 cm   LV E/e' medial:  9.3 LV IVS:        0.60 cm   LV e' lateral:   19.00 cm/s LVOT diam:     1.90 cm   LV E/e' lateral: 5.0 LV SV:         55 LV SV Index:   32 LVOT Area:     2.84 cm  RIGHT VENTRICLE RV S prime:     15.10 cm/s TAPSE (M-mode): 2.2 cm LEFT ATRIUM             Index        RIGHT ATRIUM           Index LA diam:        1.90 cm 1.09 cm/m   RA Area:     11.50 cm LA Vol (A2C):   27.2 ml 15.58 ml/m  RA Volume:   25.30 ml  14.49 ml/m LA Vol (A4C):   20.7 ml 11.86 ml/m LA Biplane Vol: 24.2 ml 13.86 ml/m  AORTIC VALVE LVOT Vmax:   117.00 cm/s LVOT Vmean:  78.300 cm/s LVOT VTI:    0.195 m  AORTA Ao Root diam: 3.10 cm Ao Asc diam:  2.60 cm MITRAL VALVE MV Area (PHT): 3.66 cm    SHUNTS MV Decel Time: 207 msec    Systemic VTI:  0.20 m MV E velocity: 95.40 cm/s  Systemic Diam: 1.90 cm MV A velocity: 59.30 cm/s MV E/A ratio:  1.61 Epifanio Lesches MD Electronically signed by Epifanio Lesches MD Signature Date/Time: 06/05/2023/3:52:22 PM    Final    CT Angio Chest PE W and/or Wo Contrast  Result Date: 06/05/2023 CLINICAL DATA:  Presented following a syncopal episode with chest pain prior to syncope. There is a positive D-dimer and low to intermediate probability of pulmonary embolism. EXAM: CT ANGIOGRAPHY CHEST WITH CONTRAST  TECHNIQUE: Multidetector CT imaging of the chest was performed using the standard protocol during bolus administration of intravenous contrast. Multiplanar CT image reconstructions and MIPs were obtained to evaluate the vascular anatomy. RADIATION DOSE REDUCTION: This exam was performed according to the departmental dose-optimization program which includes automated exposure control, adjustment of the mA and/or kV according to patient size and/or use of iterative reconstruction technique. CONTRAST:  75mL OMNIPAQUE IOHEXOL 350 MG/ML SOLN COMPARISON:  Portable chest today is the only relevant prior. FINDINGS: Cardiovascular: Satisfactory opacification of the pulmonary arteries to the segmental level. No evidence of pulmonary embolism. The heart is slightly enlarged. No pericardial effusion. There is no venous dilatation. The aorta and great vessels are normal. Mediastinum/Nodes: No enlarged mediastinal, hilar, or axillary lymph nodes. Thyroid gland, trachea, and esophagus demonstrate no significant findings. Lungs/Pleura: No pleural effusion, thickening or pneumothorax. There is mild posterior atelectasis in the lower lobes. There is a 5 mm nodule in the posterior basal left lower lobe on 7:92. No other nodules are seen. No focal pneumonia is evident. Linear scarring or atelectasis is noted in the medial basal left lower lobe. Upper Abdomen: No acute abnormality. Musculoskeletal: No chest wall abnormality. No acute or significant osseous findings. Review of the MIP images confirms the above findings. IMPRESSION: 1. No acute chest CT or CTA findings. 2. Mild cardiomegaly.  No evidence of CHF. 3. 5 mm left lower lobe nodule. No follow-up needed if patient is low-risk.This recommendation follows the consensus statement: Guidelines for Management of  Incidental Pulmonary Nodules Detected on CT Images: From the Fleischner Society 2017; Radiology 2017; 284:228-243. Electronically Signed   By: Almira Bar M.D.   On:  06/05/2023 02:15   DG Chest Portable 1 View  Result Date: 06/05/2023 CLINICAL DATA:  Check chest pain EXAM: PORTABLE CHEST 1 VIEW COMPARISON:  None Available. FINDINGS: The heart size and mediastinal contours are within normal limits. Both lungs are clear. The visualized skeletal structures are unremarkable. IMPRESSION: No active disease. Electronically Signed   By: Charlett Nose M.D.   On: 06/05/2023 00:45    Microbiology: No results found for this or any previous visit.  Labs: CBC: Recent Labs  Lab 06/05/23 0047 06/05/23 0644  WBC 9.7 8.6  NEUTROABS 7.2 5.8  HGB 14.4 14.4  HCT 42.1 42.2  MCV 80.7 80.2  PLT 260 255   Basic Metabolic Panel: Recent Labs  Lab 06/05/23 0047 06/05/23 0644  NA 139 138  K 3.8 4.1  CL 104 101  CO2 24 23  GLUCOSE 108* 103*  BUN 20 11  CREATININE 0.80 0.85  CALCIUM 9.7 9.1  MG  --  2.2   Liver Function Tests: Recent Labs  Lab 06/05/23 0644  AST 18  ALT 15  ALKPHOS 60  BILITOT 0.7  PROT 7.5  ALBUMIN 3.8   CBG: No results for input(s): "GLUCAP" in the last 168 hours.  Discharge time spent: approximately 35 minutes spent on discharge counseling, evaluation of patient on day of discharge, and coordination of discharge planning with nursing, social work, pharmacy and case management  Signed: Alberteen Sam, MD [facility] 06/05/2023

## 2023-06-05 NOTE — ED Provider Notes (Signed)
[place] EMERGENCY DEPARTMENT AT [facility] Provider Note   CSN: [id number] Arrival date & time: 06/04/23  2348     History  Chief Complaint  Patient presents with   Chest Pain   Loss of Consciousness    Annette Odom is a 32 y.o. female.  HPI     This is a 32 year old female who presents with syncope and chest pain.  Patient reports just prior to arrival she drank 2 sips of [facility] and had sharp chest pain over the anterior chest.  She states that the next thing that she knew she was on the floor.  When she woke up she had pain and paresthesia that radiated into the left arm.  She denies any prodrome to syncope.  Did not have any lightheadedness.  This is never happened to her before.  No history of blood clots but is on birth control.  Does not believe herself to be pregnant.  Denies any recent illnesses, fevers, cough, shortness of breath.  No family history of cardiac disease or sudden cardiac death to her knowledge.  No history of arrhythmia.  Denies alcohol or drug use.  Home Medications Prior to Admission medications   Medication Sig Start Date End Date Taking? Authorizing Provider  cetirizine (ZYRTEC) 10 MG tablet Take 10 mg by mouth daily.    [provider]  Dapsone 5 % topical gel APPLY TO [facility] AT BEDTIME AS DIRECTED 02/15/20   Clark-Burning, Victorino Dike, PA-C  dicyclomine (BENTYL) 10 MG capsule TAKE 1 CAPSULE BY MOUTH 1 OR 2 TIMES DAILY AS NEEDED FOR SPASMS 04/18/21   Unk Lightning, PA  methylphenidate (CONCERTA) 54 MG CR tablet Take 54 mg by mouth every morning.    [provider]      Allergies    Patient has no known allergies.    Review of Systems   Review of Systems  Constitutional:  Negative for fever.  Respiratory:  Negative for shortness of breath.   Cardiovascular:  Positive for chest pain. Negative for leg swelling.  Gastrointestinal:  Negative for abdominal pain.  Neurological:  Positive for syncope.  All other  systems reviewed and are negative.   Physical Exam Updated Vital Signs BP (!) 131/91   Pulse 96   Temp 98.1 F (36.7 C) (Oral)   Resp 17   Wt 70.3 kg   LMP 05/26/2023   SpO2 100%   BMI 27.46 kg/m  Physical Exam Vitals and nursing note reviewed.  Constitutional:      Appearance: She is well-developed. She is not ill-appearing.  HENT:     Head: Normocephalic and atraumatic.  Eyes:     Pupils: Pupils are equal, round, and reactive to light.  Cardiovascular:     Rate and Rhythm: Normal rate and regular rhythm.     Heart sounds: Normal heart sounds.  Pulmonary:     Effort: Pulmonary effort is normal. No respiratory distress.     Breath sounds: No wheezing.  Abdominal:     General: Bowel sounds are normal.     Palpations: Abdomen is soft.  Musculoskeletal:     Cervical back: Neck supple.  Skin:    General: Skin is warm and dry.  Neurological:     Mental Status: She is alert and oriented to person, place, and time.  Psychiatric:        Mood and Affect: Mood normal.     ED Results / Procedures / Treatments   Labs (all labs ordered are  listed, but only abnormal results are displayed) Labs Reviewed  CBC WITH DIFFERENTIAL/PLATELET - Abnormal; Notable for the following components:      Result Value   RBC 5.22 (*)    All other components within normal limits  BASIC METABOLIC PANEL - Abnormal; Notable for the following components:   Glucose, Bld 108 (*)    All other components within normal limits  D-DIMER, QUANTITATIVE - Abnormal; Notable for the following components:   D-Dimer, Quant 1.09 (*)    All other components within normal limits  PREGNANCY, URINE  TROPONIN I (HIGH SENSITIVITY)  TROPONIN I (HIGH SENSITIVITY)    EKG EKG Interpretation Date/Time:  Saturday June 05 2023 00:00:09 EDT Ventricular Rate:  83 PR Interval:  136 QRS Duration:  89 QT Interval:  364 QTC Calculation: 428 R Axis:   12  Text Interpretation: Sinus rhythm Low voltage, precordial  leads RSR' in V1 or V2, right VCD or RVH Confirmed by Ross Marcus ([id number]) on 06/05/2023 12:12:51 AM  Radiology CT Angio Chest PE W and/or Wo Contrast  Result Date: 06/05/2023 CLINICAL DATA:  Presented following a syncopal episode with chest pain prior to syncope. There is a positive D-dimer and low to intermediate probability of pulmonary embolism. EXAM: CT ANGIOGRAPHY CHEST WITH CONTRAST TECHNIQUE: Multidetector CT imaging of the chest was performed using the standard protocol during bolus administration of intravenous contrast. Multiplanar CT image reconstructions and MIPs were obtained to evaluate the vascular anatomy. RADIATION DOSE REDUCTION: This exam was performed according to the departmental dose-optimization program which includes automated exposure control, adjustment of the mA and/or kV according to patient size and/or use of iterative reconstruction technique. CONTRAST:  75mL OMNIPAQUE IOHEXOL 350 MG/ML SOLN COMPARISON:  Portable chest today is the only relevant prior. FINDINGS: Cardiovascular: Satisfactory opacification of the pulmonary arteries to the segmental level. No evidence of pulmonary embolism. The heart is slightly enlarged. No pericardial effusion. There is no venous dilatation. The aorta and great vessels are normal. Mediastinum/Nodes: No enlarged mediastinal, hilar, or axillary lymph nodes. Thyroid gland, trachea, and esophagus demonstrate no significant findings. Lungs/Pleura: No pleural effusion, thickening or pneumothorax. There is mild posterior atelectasis in the lower lobes. There is a 5 mm nodule in the posterior basal left lower lobe on 7:92. No other nodules are seen. No focal pneumonia is evident. Linear scarring or atelectasis is noted in the medial basal left lower lobe. Upper Abdomen: No acute abnormality. Musculoskeletal: No chest wall abnormality. No acute or significant osseous findings. Review of the MIP images confirms the above findings. IMPRESSION: 1. No acute  chest CT or CTA findings. 2. Mild cardiomegaly.  No evidence of CHF. 3. 5 mm left lower lobe nodule. No follow-up needed if patient is low-risk.This recommendation follows the consensus statement: Guidelines for Management of Incidental Pulmonary Nodules Detected on CT Images: From the Fleischner Society 2017; Radiology 2017; 284:228-243. Electronically Signed   By: Almira Bar M.D.   On: 06/05/2023 02:15   DG Chest Portable 1 View  Result Date: 06/05/2023 CLINICAL DATA:  Check chest pain EXAM: PORTABLE CHEST 1 VIEW COMPARISON:  None Available. FINDINGS: The heart size and mediastinal contours are within normal limits. Both lungs are clear. The visualized skeletal structures are unremarkable. IMPRESSION: No active disease. Electronically Signed   By: Charlett Nose M.D.   On: 06/05/2023 00:45    Procedures Procedures    Medications Ordered in ED Medications  iohexol (OMNIPAQUE) 350 MG/ML injection 100 mL (75 mLs Intravenous Contrast Given 06/05/23 0143)  ED Course/ Medical Decision Making/ A&P                                 Medical Decision Making Amount and/or Complexity of Data Reviewed Labs: ordered. Radiology: ordered.  Risk Prescription drug management. Decision regarding hospitalization.   This patient presents to the ED for concern of syncope, chest pain, this involves an extensive number of treatment options, and is a complaint that carries with it a high risk of complications and morbidity.  I considered the following differential and admission for this acute, potentially life threatening condition.  The differential diagnosis includes ACS, PE, pneumothorax, pneumonia, arrhythmia  MDM:    This is a 32 year old female who presents with an episode of syncope and chest pain.  She is currently nontoxic and vital signs are notable for blood pressure 131/91.  She had no prodrome which is more concerning.  EKG does not show any obvious arrhythmia or ischemic changes.  Troponin x  1 negative.  D-dimer positive.  X-ray does not show any evidence of pneumothorax or pneumonia.  CT PE study shows mild cardiomegaly.  No known history.  Patient has remained clinically stable and without arrhythmia on telemetry.  I discussed with her that given her syncope and mild cardiomegaly, she likely warrants echocardiogram.  [place] of town.  It will be difficult for her to have close follow-up with cardiology.  Will plan for an observation admission for telemetry and echocardiogram.  She is agreeable to this plan.  (Labs, imaging, consults)  Labs: I Ordered, and personally interpreted labs.  The pertinent results include: CBC, BMP, troponin, D-dimer  Imaging Studies ordered: I ordered imaging studies including chest x-ray, CT chest I independently visualized and interpreted imaging. I agree with the radiologist interpretation  Additional history obtained from chart review.  External records from outside source obtained and reviewed including prior evaluations  Cardiac Monitoring: The patient was maintained on a cardiac monitor.  If on the cardiac monitor, I personally viewed and interpreted the cardiac monitored which showed an underlying rhythm of: Sinus rhythm  Reevaluation: After the interventions noted above, I reevaluated the patient and found that they have :stayed the same  Social Determinants of Health:  lives independently  Disposition: Discharge  Co morbidities that complicate the patient evaluation  Past Medical History:  Diagnosis Date   ADD (attention deficit disorder)    GERD (gastroesophageal reflux disease)      Medicines Meds ordered this encounter  Medications   iohexol (OMNIPAQUE) 350 MG/ML injection 100 mL    I have reviewed the patients home medicines and have made adjustments as needed  Problem List / ED Course: Problem List Items Addressed This Visit   None Visit Diagnoses     Syncope, unspecified syncope type    -  Primary    Cardiomegaly                       Final Clinical Impression(s) / ED Diagnoses Final diagnoses:  Syncope, unspecified syncope type  Cardiomegaly    Rx / DC Orders ED Discharge Orders     None         Shon Baton, MD 06/05/23 [id number]

## 2023-06-05 NOTE — ED Triage Notes (Signed)
Pt in with syncopal event that happened 1hr PTA. Pt states she began to have central cp with L arm radiation, then passed out to floor. Denies any injuries from fall. Pt currently reporting chest soreness and L hand tingling. Denies sob or n/v

## 2023-06-05 NOTE — Progress Notes (Signed)
Plan of Care Note for accepted transfer   Patient: Annette Odom MRN: [id number]   DOA: 06/05/2023  Facility requesting transfer: [facility]   Requesting Provider: Dr. Wilkie Aye   Reason for transfer: Syncope   Facility course: 32 yr old female with hx of GERD and ADD presents after a syncopal episode that was preceded by acute-onset chest pain.   EKG demonstrates SR with normal intervals, no blocks. D-dimer was 1.08. CTA chest negative for acute findings but notable for mild cardiomegaly.   Plan of care: The patient is accepted for admission to Telemetry unit, at [facility].   Author: Briscoe Deutscher, MD 06/05/2023  Check [email or web address] for on-call coverage.  Nursing staff, Please call [facility] Admits & Consults System-Wide number on Amion as soon as patient's arrival, so appropriate admitting provider can evaluate the pt.

## 2023-06-05 NOTE — H&P (Signed)
History and Physical      Annette Odom ZOX:[id number] DOB: March 28, 1991 DOA: 06/05/2023; DOS: 06/05/2023  PCP: Annette Pile, MD (Inactive)  Patient coming from: home   I have personally briefly reviewed patient's old medical records in [facility]  Chief Complaint: Syncope  HPI: Annette Odom is a 32 y.o. female with medical history significant for ADD, GERD, who is admitted to [facility] on 06/05/2023 by way of transfer from [facility] with syncope after presenting from home to the latter facility complaining of syncope.  The patient reports a single episode of loss of consciousness occurring on 06/04/2023.  She notes that she was consuming [facility] during which time she developed sharp nonradiating substernal chest discomfort.  While still experiencing this discomfort, she lost consciousness, awakening on the ground.  She denies any preceding dizziness, lightheadedness, palpitations, diaphoresis, or subjective sensation of impending loss of consciousness.  Chest pain was not exertional, nonpleuritic, and not reproducible with palpation over the anterior chest wall.  Not associate with any nausea, vomiting, subjective fever, chills, rigors, generalized myalgias.  No prior history of syncope.  No known history of tachyarrhythmia or accessory conductive cardiac pathway.  Denies any use of recreational drugs.  Not associate with any tonic-clonic activity, tongue biting, or loss of bowel/bladder function.  Denies any acute focal weakness.     [facility] ED Course:  Vital signs in the ED were notable for the following: Afebrile; heart rates in the 60s to 90s; systolic blood pressures in the low 100s to 130s; respiratory rate 17-18, oxygen saturation 98-100 on room air.  Labs were notable for the following: BMP notable for potassium 3.8, bicarbonate 24, creatinine 0.80 relative to most recent prior serum creatinine data point of 0.72 in June 2021, glucose 108.  High sensitive  troponin I x 2 were both found to be less than 2.  D-dimer 1.09.  CBC notable for will with cell count 9700, hemoglobin 14.4.  Per my interpretation, EKG in ED demonstrated the following: EKG shows sinus rhythm with heart rate 83, normal intervals, no evidence of T wave or ST changes, including evidence of ST ovation.  Imaging in the ED, per corresponding formal radiology read, was notable for the following: CTA chest with PE protocol showed no evidence of acute part of cardiopulmonary process, Cleen evidence of pulmonary embolism or any evidence of infiltrate, edema, effusion, or pneumothorax.  While in the ED, the following were administered: None  Subsequently, the patient was admitted to [facility] for further evaluation management of single episode of syncope in the absence of prodrome.      Review of Systems: As per HPI otherwise 10 point review of systems negative.   Past Medical History:  Diagnosis Date   ADD (attention deficit disorder)    GERD (gastroesophageal reflux disease)     Past Surgical History:  Procedure Laterality Date   WISDOM TOOTH EXTRACTION      Social History:  reports that she has never smoked. She has never used smokeless tobacco. She reports that she does not drink alcohol and does not use drugs.   No Known Allergies  Family History  Problem Relation Age of Onset   Crohn's disease Cousin    Heart disease Maternal Grandfather     Family history reviewed and not pertinent    Prior to Admission medications   Medication Sig Start Date End Date Taking? Authorizing Provider  cetirizine (ZYRTEC) 10 MG tablet Take 10 mg by mouth daily.  [provider]  Dapsone 5 % topical gel APPLY TO [facility] AT BEDTIME AS DIRECTED 02/15/20   Odom, Annette Dike, PA-C  dicyclomine (BENTYL) 10 MG capsule TAKE 1 CAPSULE BY MOUTH 1 OR 2 TIMES DAILY AS NEEDED FOR SPASMS 04/18/21   Annette Lightning, PA  methylphenidate (CONCERTA) 54 MG CR tablet Take 54 mg  by mouth every morning.    [provider]     Objective    Physical Exam: Vitals:   06/05/23 0006 06/05/23 0031 06/05/23 0345 06/05/23 0507  BP:   102/89 128/84  Pulse:   67   Resp:   18 18  Temp: 98.1 F (36.7 C)  98.2 F (36.8 C) 98.1 F (36.7 C)  TempSrc: Oral  Oral Oral  SpO2:  100% 99% 98%  Weight: 70.3 kg   71.4 kg  Height:    5\' 3"  (1.6 m)    General: appears to be stated age; alert, oriented Skin: warm, dry, no rash Head:  AT/[place] Mouth:  Oral mucosa membranes appear moist, normal dentition Neck: supple; trachea midline Heart:  RRR; did not appreciate any M/R/G Lungs: CTAB, did not appreciate any wheezes, rales, or rhonchi Abdomen: + BS; soft, ND, NT Vascular: 2+ pedal pulses b/l; 2+ radial pulses b/l Extremities: no peripheral edema, no muscle wasting Neuro: strength and sensation intact in upper and lower extremities b/l    Labs on Admission: I have personally reviewed following labs and imaging studies  CBC: Recent Labs  Lab 06/05/23 0047  WBC 9.7  NEUTROABS 7.2  HGB 14.4  HCT 42.1  MCV 80.7  PLT 260   Basic Metabolic Panel: Recent Labs  Lab 06/05/23 0047  NA 139  K 3.8  CL 104  CO2 24  GLUCOSE 108*  BUN 20  CREATININE 0.80  CALCIUM 9.7   GFR: Estimated Creatinine Clearance: 97.4 mL/min (by C-G formula based on SCr of 0.8 mg/dL). Liver Function Tests: No results for input(s): "AST", "ALT", "ALKPHOS", "BILITOT", "PROT", "ALBUMIN" in the last 168 hours. No results for input(s): "LIPASE", "AMYLASE" in the last 168 hours. No results for input(s): "AMMONIA" in the last 168 hours. Coagulation Profile: No results for input(s): "INR", "PROTIME" in the last 168 hours. Cardiac Enzymes: No results for input(s): "CKTOTAL", "CKMB", "CKMBINDEX", "TROPONINI" in the last 168 hours. BNP (last 3 results) No results for input(s): "PROBNP" in the last 8760 hours. HbA1C: No results for input(s): "HGBA1C" in the last 72 hours. CBG: No results  for input(s): "GLUCAP" in the last 168 hours. Lipid Profile: No results for input(s): "CHOL", "HDL", "LDLCALC", "TRIG", "CHOLHDL", "LDLDIRECT" in the last 72 hours. Thyroid Function Tests: No results for input(s): "TSH", "T4TOTAL", "FREET4", "T3FREE", "THYROIDAB" in the last 72 hours. Anemia Panel: No results for input(s): "VITAMINB12", "FOLATE", "FERRITIN", "TIBC", "IRON", "RETICCTPCT" in the last 72 hours. Urine analysis: No results found for: "COLORURINE", "APPEARANCEUR", "LABSPEC", "PHURINE", "GLUCOSEU", "HGBUR", "BILIRUBINUR", "KETONESUR", "PROTEINUR", "UROBILINOGEN", "NITRITE", "LEUKOCYTESUR"  Radiological Exams on Admission: CT Angio Chest PE W and/or Wo Contrast  Result Date: 06/05/2023 CLINICAL DATA:  Presented following a syncopal episode with chest pain prior to syncope. There is a positive D-dimer and low to intermediate probability of pulmonary embolism. EXAM: CT ANGIOGRAPHY CHEST WITH CONTRAST TECHNIQUE: Multidetector CT imaging of the chest was performed using the standard protocol during bolus administration of intravenous contrast. Multiplanar CT image reconstructions and MIPs were obtained to evaluate the vascular anatomy. RADIATION DOSE REDUCTION: This exam was performed according to the departmental dose-optimization program which includes automated  exposure control, adjustment of the mA and/or kV according to patient size and/or use of iterative reconstruction technique. CONTRAST:  75mL OMNIPAQUE IOHEXOL 350 MG/ML SOLN COMPARISON:  Portable chest today is the only relevant prior. FINDINGS: Cardiovascular: Satisfactory opacification of the pulmonary arteries to the segmental level. No evidence of pulmonary embolism. The heart is slightly enlarged. No pericardial effusion. There is no venous dilatation. The aorta and great vessels are normal. Mediastinum/Nodes: No enlarged mediastinal, hilar, or axillary lymph nodes. Thyroid gland, trachea, and esophagus demonstrate no significant  findings. Lungs/Pleura: No pleural effusion, thickening or pneumothorax. There is mild posterior atelectasis in the lower lobes. There is a 5 mm nodule in the posterior basal left lower lobe on 7:92. No other nodules are seen. No focal pneumonia is evident. Linear scarring or atelectasis is noted in the medial basal left lower lobe. Upper Abdomen: No acute abnormality. Musculoskeletal: No chest wall abnormality. No acute or significant osseous findings. Review of the MIP images confirms the above findings. IMPRESSION: 1. No acute chest CT or CTA findings. 2. Mild cardiomegaly.  No evidence of CHF. 3. 5 mm left lower lobe nodule. No follow-up needed if patient is low-risk.This recommendation follows the consensus statement: Guidelines for Management of Incidental Pulmonary Nodules Detected on CT Images: From the Fleischner Society 2017; Radiology 2017; 284:228-243. Electronically Signed   By: Almira Bar M.D.   On: 06/05/2023 02:15   DG Chest Portable 1 View  Result Date: 06/05/2023 CLINICAL DATA:  Check chest pain EXAM: PORTABLE CHEST 1 VIEW COMPARISON:  None Available. FINDINGS: The heart size and mediastinal contours are within normal limits. Both lungs are clear. The visualized skeletal structures are unremarkable. IMPRESSION: No active disease. Electronically Signed   By: Charlett Nose M.D.   On: 06/05/2023 00:45      Assessment/Plan   Principal Problem:   Syncope Active Problems:   GERD (gastroesophageal reflux disease)   Atypical chest pain   ADD (attention deficit disorder)     #) Syncope: A single episode of syncope without any prodrome, rendering increase likelihood of arrhythmia, including ventricular arrhythmia versus accessory pathway.  ACS appears less likely at this time in the absence of any typical chest pain, while EKG shows no evidence of acute ischemic changes, will and troponin x 2 were found to be nonelevated and flat.  CT a chest showed no evidence of acute cardiopulmonary  process, including no evidence of pulmonary embolism.  No evidence clinically of stroke.  As this episode of syncope was immediately preceded by chest discomfort with consumption of Dew, there would otherwise be consideration for vasovagal syncope as a consequence of associated concomitant discomfort.  However, in the absence of prodrome renders this possibility to appear less likely at this time.  Plan: Monitor on symmetry.  Trend troponin.  Echocardiogram ordered for the morning.  Monitor strict I's and O's Daily weights.  Potassium chloride 40 mill equivalents p.o. x 1 dose now.  Add on serum magnesium level.  CMP, CBC in the morning.             #) Atypical chest pain: Single episode nonexertional sharp chest discomfort that was prandial in nature, occurring with the active consumption of Dew, raising the possibility of esophageal spasm versus esophagitis versus gastritis versus peptic ulcer disease.  Of note, she has a documented history of GERD, and does not appear to be on any pharmacologic management of such, including no H2 blockers nor PPI nor any prn Tums.  The patient's chest  pain appears atypical for ACS, as further detailed above, and CTA chest showed no evidence of acute cardiopulmonary process, including no evidence of acute pulmonary embolism or any evidence of pneumothorax.  Plan: Continue to trend troponin.  Echocardiogram.  Check urine drug screen, CRP, ESR, lipase.  Protonix 40 mg IV daily, with first dose now.  CMP, CBC.                 #) ADD: Documented history of such, on some Concerta as an outpatient.  Plan: Will hold him Concerta for now to limit anticholinergic influences at this medication on presenting chest pain/syncopal episode.                 #) GERD: documented h/o such; not on any pharmacologic management of such as an outpatient, as further detailed above.  Plan: Protonix 40 mg IV daily, first dose now.       DVT  prophylaxis: SCD's   Code Status: Full code Family Communication: none Disposition Plan: Per Rounding Team Consults called: none;  Admission status: obs     I SPENT GREATER THAN 75  MINUTES IN CLINICAL CARE TIME/MEDICAL DECISION-MAKING IN COMPLETING THIS ADMISSION.      Chaney Born Sacha Radloff DO [facility]  From 7PM - 7AM   06/05/2023, 6:30 AM
